# Patient Record
Sex: Female | Born: 1975
Health system: Southern US, Community
[De-identification: ages and names within clinical notes are randomized; demographics above are authoritative.]

## PROBLEM LIST (undated history)

## (undated) DIAGNOSIS — E7800 Pure hypercholesterolemia, unspecified: Secondary | ICD-10-CM

---

## 2007-09-13 ENCOUNTER — Emergency Department: Payer: Self-pay | Admitting: Emergency Medicine

## 2007-09-16 ENCOUNTER — Emergency Department: Payer: Self-pay | Admitting: Emergency Medicine

## 2007-09-18 ENCOUNTER — Emergency Department: Payer: Self-pay | Admitting: Emergency Medicine

## 2007-10-25 ENCOUNTER — Emergency Department: Payer: Self-pay | Admitting: Emergency Medicine

## 2011-01-27 ENCOUNTER — Emergency Department: Payer: Self-pay | Admitting: Emergency Medicine

## 2011-03-24 ENCOUNTER — Ambulatory Visit: Payer: Self-pay

## 2015-06-14 ENCOUNTER — Other Ambulatory Visit: Payer: Self-pay | Admitting: Obstetrics and Gynecology

## 2015-06-14 DIAGNOSIS — Z1231 Encounter for screening mammogram for malignant neoplasm of breast: Secondary | ICD-10-CM

## 2015-06-27 ENCOUNTER — Ambulatory Visit
Admission: RE | Admit: 2015-06-27 | Discharge: 2015-06-27 | Disposition: A | Discharge status: Home / Self Care | Payer: 59 | Source: Ambulatory Visit | Attending: Obstetrics and Gynecology | Admitting: Obstetrics and Gynecology

## 2015-06-27 DIAGNOSIS — Z1231 Encounter for screening mammogram for malignant neoplasm of breast: Secondary | ICD-10-CM | POA: Diagnosis not present

## 2015-07-04 ENCOUNTER — Other Ambulatory Visit: Payer: Self-pay | Admitting: Obstetrics and Gynecology

## 2015-07-04 DIAGNOSIS — N6320 Unspecified lump in the left breast, unspecified quadrant: Secondary | ICD-10-CM

## 2015-07-11 ENCOUNTER — Ambulatory Visit
Admission: RE | Admit: 2015-07-11 | Discharge: 2015-07-11 | Disposition: A | Discharge status: Home / Self Care | Payer: 59 | Source: Ambulatory Visit | Attending: Obstetrics and Gynecology | Admitting: Obstetrics and Gynecology

## 2015-07-11 DIAGNOSIS — N63 Unspecified lump in breast: Secondary | ICD-10-CM | POA: Diagnosis present

## 2015-07-11 DIAGNOSIS — N6320 Unspecified lump in the left breast, unspecified quadrant: Secondary | ICD-10-CM

## 2015-07-12 ENCOUNTER — Other Ambulatory Visit: Payer: 59

## 2016-02-10 DIAGNOSIS — E7800 Pure hypercholesterolemia, unspecified: Secondary | ICD-10-CM | POA: Diagnosis not present

## 2016-02-18 DIAGNOSIS — E7800 Pure hypercholesterolemia, unspecified: Secondary | ICD-10-CM | POA: Diagnosis not present

## 2016-05-13 DIAGNOSIS — J301 Allergic rhinitis due to pollen: Secondary | ICD-10-CM | POA: Diagnosis not present

## 2016-06-16 ENCOUNTER — Other Ambulatory Visit: Payer: Self-pay | Admitting: Obstetrics and Gynecology

## 2016-06-16 DIAGNOSIS — N62 Hypertrophy of breast: Secondary | ICD-10-CM | POA: Diagnosis not present

## 2016-06-16 DIAGNOSIS — Z01419 Encounter for gynecological examination (general) (routine) without abnormal findings: Secondary | ICD-10-CM | POA: Diagnosis not present

## 2016-06-16 DIAGNOSIS — Z1211 Encounter for screening for malignant neoplasm of colon: Secondary | ICD-10-CM | POA: Diagnosis not present

## 2016-06-16 DIAGNOSIS — Z1231 Encounter for screening mammogram for malignant neoplasm of breast: Secondary | ICD-10-CM

## 2016-06-22 DIAGNOSIS — Z01419 Encounter for gynecological examination (general) (routine) without abnormal findings: Secondary | ICD-10-CM | POA: Diagnosis not present

## 2016-07-21 ENCOUNTER — Ambulatory Visit
Admission: RE | Admit: 2016-07-21 | Discharge: 2016-07-21 | Disposition: A | Discharge status: Home / Self Care | Payer: 59 | Source: Ambulatory Visit | Attending: Obstetrics and Gynecology | Admitting: Obstetrics and Gynecology

## 2016-07-21 DIAGNOSIS — Z1231 Encounter for screening mammogram for malignant neoplasm of breast: Secondary | ICD-10-CM | POA: Insufficient documentation

## 2016-08-12 DIAGNOSIS — E7800 Pure hypercholesterolemia, unspecified: Secondary | ICD-10-CM | POA: Diagnosis not present

## 2016-08-18 DIAGNOSIS — E7800 Pure hypercholesterolemia, unspecified: Secondary | ICD-10-CM | POA: Diagnosis not present

## 2016-12-16 DIAGNOSIS — B9689 Other specified bacterial agents as the cause of diseases classified elsewhere: Secondary | ICD-10-CM | POA: Diagnosis not present

## 2016-12-16 DIAGNOSIS — J019 Acute sinusitis, unspecified: Secondary | ICD-10-CM | POA: Diagnosis not present

## 2016-12-16 DIAGNOSIS — J209 Acute bronchitis, unspecified: Secondary | ICD-10-CM | POA: Diagnosis not present

## 2017-02-04 DIAGNOSIS — Z0000 Encounter for general adult medical examination without abnormal findings: Secondary | ICD-10-CM | POA: Diagnosis not present

## 2017-02-04 DIAGNOSIS — E7800 Pure hypercholesterolemia, unspecified: Secondary | ICD-10-CM | POA: Diagnosis not present

## 2017-02-25 DIAGNOSIS — E7800 Pure hypercholesterolemia, unspecified: Secondary | ICD-10-CM | POA: Diagnosis not present

## 2017-02-25 DIAGNOSIS — Z0000 Encounter for general adult medical examination without abnormal findings: Secondary | ICD-10-CM | POA: Diagnosis not present

## 2017-03-10 DIAGNOSIS — H5213 Myopia, bilateral: Secondary | ICD-10-CM | POA: Diagnosis not present

## 2017-07-28 ENCOUNTER — Other Ambulatory Visit: Payer: Self-pay | Admitting: Obstetrics and Gynecology

## 2017-07-28 DIAGNOSIS — Z1231 Encounter for screening mammogram for malignant neoplasm of breast: Secondary | ICD-10-CM | POA: Diagnosis not present

## 2017-07-28 DIAGNOSIS — Z01419 Encounter for gynecological examination (general) (routine) without abnormal findings: Secondary | ICD-10-CM | POA: Diagnosis not present

## 2017-07-28 DIAGNOSIS — Z1211 Encounter for screening for malignant neoplasm of colon: Secondary | ICD-10-CM | POA: Diagnosis not present

## 2017-08-18 ENCOUNTER — Ambulatory Visit
Admission: RE | Admit: 2017-08-18 | Discharge: 2017-08-18 | Disposition: A | Discharge status: Home / Self Care | Payer: 59 | Source: Ambulatory Visit | Attending: Obstetrics and Gynecology | Admitting: Obstetrics and Gynecology

## 2017-08-18 DIAGNOSIS — Z1231 Encounter for screening mammogram for malignant neoplasm of breast: Secondary | ICD-10-CM | POA: Diagnosis not present

## 2017-08-19 DIAGNOSIS — E7800 Pure hypercholesterolemia, unspecified: Secondary | ICD-10-CM | POA: Diagnosis not present

## 2017-08-26 DIAGNOSIS — E7800 Pure hypercholesterolemia, unspecified: Secondary | ICD-10-CM | POA: Diagnosis not present

## 2017-11-01 DIAGNOSIS — Z3042 Encounter for surveillance of injectable contraceptive: Secondary | ICD-10-CM | POA: Diagnosis not present

## 2017-12-07 ENCOUNTER — Ambulatory Visit: Payer: 59 | Admitting: Podiatry

## 2017-12-21 ENCOUNTER — Encounter: Payer: Self-pay | Admitting: Podiatry

## 2017-12-21 ENCOUNTER — Ambulatory Visit (INDEPENDENT_AMBULATORY_CARE_PROVIDER_SITE_OTHER): Payer: 59

## 2017-12-21 ENCOUNTER — Other Ambulatory Visit: Payer: Self-pay | Admitting: Podiatry

## 2017-12-21 ENCOUNTER — Ambulatory Visit (INDEPENDENT_AMBULATORY_CARE_PROVIDER_SITE_OTHER): Payer: 59 | Admitting: Podiatry

## 2017-12-21 VITALS — BP 122/64 | HR 66

## 2017-12-21 DIAGNOSIS — M722 Plantar fascial fibromatosis: Secondary | ICD-10-CM | POA: Diagnosis not present

## 2017-12-21 DIAGNOSIS — M79671 Pain in right foot: Secondary | ICD-10-CM

## 2017-12-21 DIAGNOSIS — M79672 Pain in left foot: Principal | ICD-10-CM

## 2017-12-21 MED ORDER — METHYLPREDNISOLONE 4 MG PO TBPK: ORAL_TABLET | ORAL | 0 refills | Status: AC

## 2017-12-21 MED ORDER — MELOXICAM 15 MG PO TABS: 15.0000 mg | ORAL_TABLET | Freq: Every day | ORAL | 1 refills | Status: AC

## 2017-12-22 NOTE — Progress Notes (Signed)
   Subjective: 42 year old female presenting today as a new patient with a chief complaint of intermittent throbbing pain located on the plantar aspect of the feet bilaterally. She reports associated burning and itching of the plantar feet. She states she recently started a new job and is on her feet more now. She has not done anything for treatment. She denies modifying factors. Patient is here for further evaluation and treatment.   History reviewed. No pertinent past medical history.   Objective: Physical Exam General: The patient is alert and oriented x3 in no acute distress.  Dermatology: Skin is warm, dry and supple bilateral lower extremities. Negative for open lesions or macerations bilateral.   Vascular: Dorsalis Pedis and Posterior Tibial pulses palpable bilateral.  Capillary fill time is immediate to all digits.  Neurological: Epicritic and protective threshold intact bilateral.   Musculoskeletal: Tenderness to palpation to the plantar aspect of the bilateral heels along the plantar fascia. All other joints range of motion within normal limits bilateral. Strength 5/5 in all groups bilateral.   Radiographic exam: Normal osseous mineralization. Joint spaces preserved. No fracture/dislocation/boney destruction. No other soft tissue abnormalities or radiopaque foreign bodies.   Assessment: 1. plantar fasciitis bilateral feet  Plan of Care:  1. Patient evaluated. Xrays reviewed.   2. Declined injections.   3. Rx for Medrol Dose Pak placed 4. Rx for Meloxicam ordered for patient. 5. Appointment with Raiford Nobleick, Pedorthist, for custom molded orthotics.  6. Instructed patient regarding therapies and modalities at home to alleviate symptoms.  7. Return to clinic in 8 weeks.    Works at a [employer] 12 hour shifts.   Felecia ShellingBrent M. Evans, DPM [facility]  Dr. Felecia ShellingBrent M. Evans, DPM    [address]                                   [place], [place] [address]                 Office [phone]  Fax [phone]

## 2018-01-19 ENCOUNTER — Ambulatory Visit (INDEPENDENT_AMBULATORY_CARE_PROVIDER_SITE_OTHER): Payer: 59 | Admitting: Orthotics

## 2018-01-19 DIAGNOSIS — M722 Plantar fascial fibromatosis: Secondary | ICD-10-CM

## 2018-01-19 NOTE — Progress Notes (Signed)

## 2018-01-24 DIAGNOSIS — Z3042 Encounter for surveillance of injectable contraceptive: Secondary | ICD-10-CM | POA: Diagnosis not present

## 2018-02-09 ENCOUNTER — Other Ambulatory Visit: Payer: 59 | Admitting: Orthotics

## 2018-02-15 ENCOUNTER — Encounter: Payer: 59 | Admitting: Podiatry

## 2018-02-20 NOTE — Progress Notes (Signed)
This encounter was created in error - please disregard.

## 2018-04-18 DIAGNOSIS — Z3042 Encounter for surveillance of injectable contraceptive: Secondary | ICD-10-CM | POA: Diagnosis not present

## 2018-05-30 DIAGNOSIS — H5213 Myopia, bilateral: Secondary | ICD-10-CM | POA: Diagnosis not present

## 2018-08-04 ENCOUNTER — Other Ambulatory Visit: Payer: Self-pay | Admitting: Obstetrics and Gynecology

## 2018-08-04 DIAGNOSIS — Z1231 Encounter for screening mammogram for malignant neoplasm of breast: Secondary | ICD-10-CM

## 2018-09-13 ENCOUNTER — Ambulatory Visit
Admission: RE | Admit: 2018-09-13 | Discharge: 2018-09-13 | Disposition: A | Discharge status: Home / Self Care | Payer: 59 | Source: Ambulatory Visit | Attending: Obstetrics and Gynecology | Admitting: Obstetrics and Gynecology

## 2018-09-13 DIAGNOSIS — Z1231 Encounter for screening mammogram for malignant neoplasm of breast: Secondary | ICD-10-CM | POA: Diagnosis not present

## 2019-09-23 IMAGING — MG MM DIGITAL SCREENING BILAT W/ TOMO W/ CAD
8 of 14 series · 8 of 38 positions shown · non-contrast
Comparison: Previous exam(s).

ACR Breast Density Category a: The breast tissue is almost entirely
fatty.

CLINICAL DATA: Screening.

EXAM:
DIGITAL SCREENING BILATERAL MAMMOGRAM WITH TOMO AND CAD

[R CC synth-2D (1 of 2)]
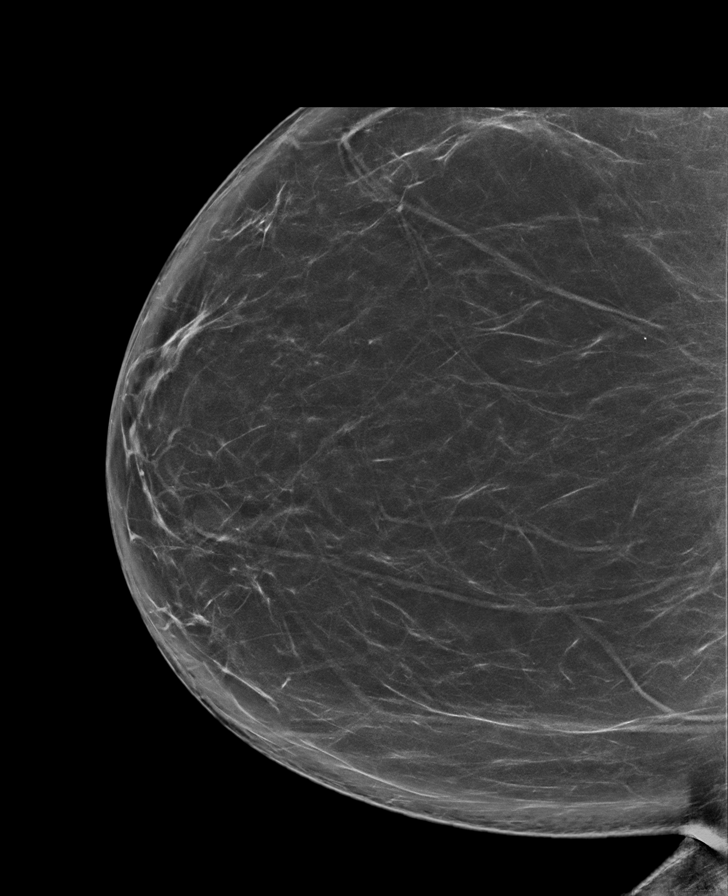

[R MLO synth-2D (1 of 2)]
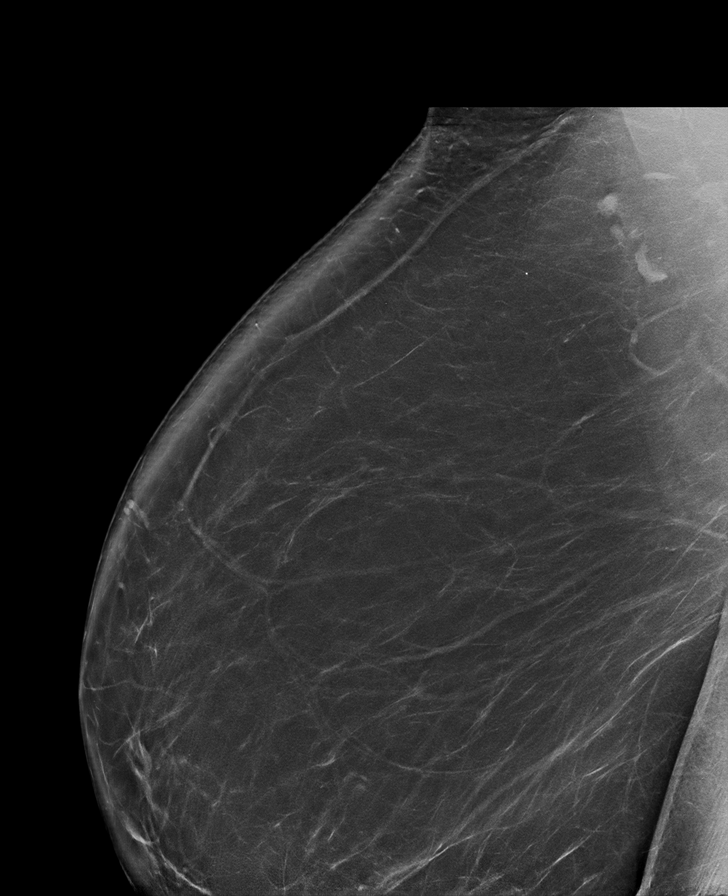

[R MLO synth-2D (2 of 2)]
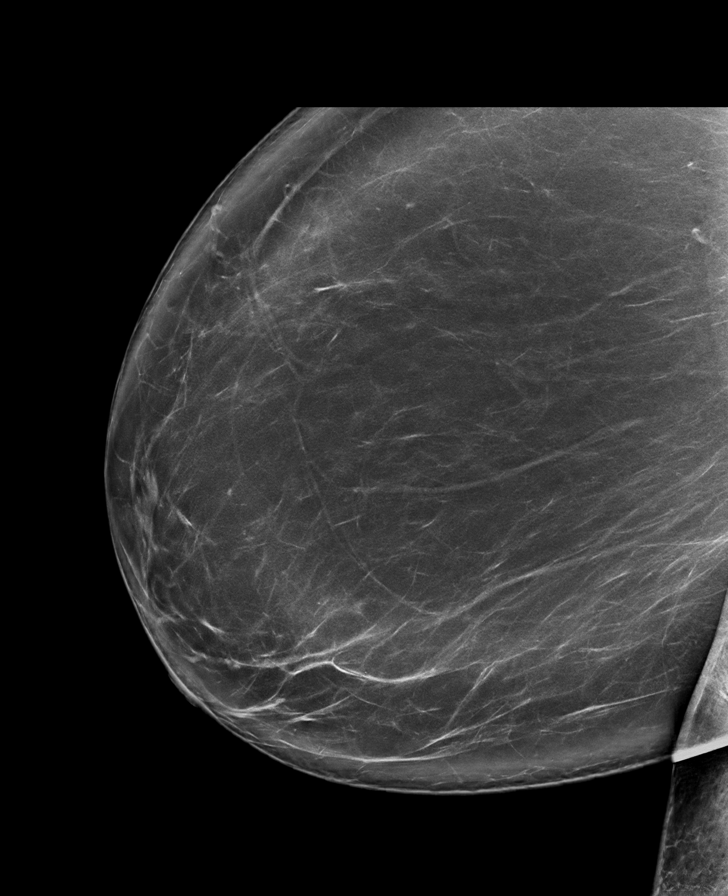

[R CC synth-2D (2 of 2)]
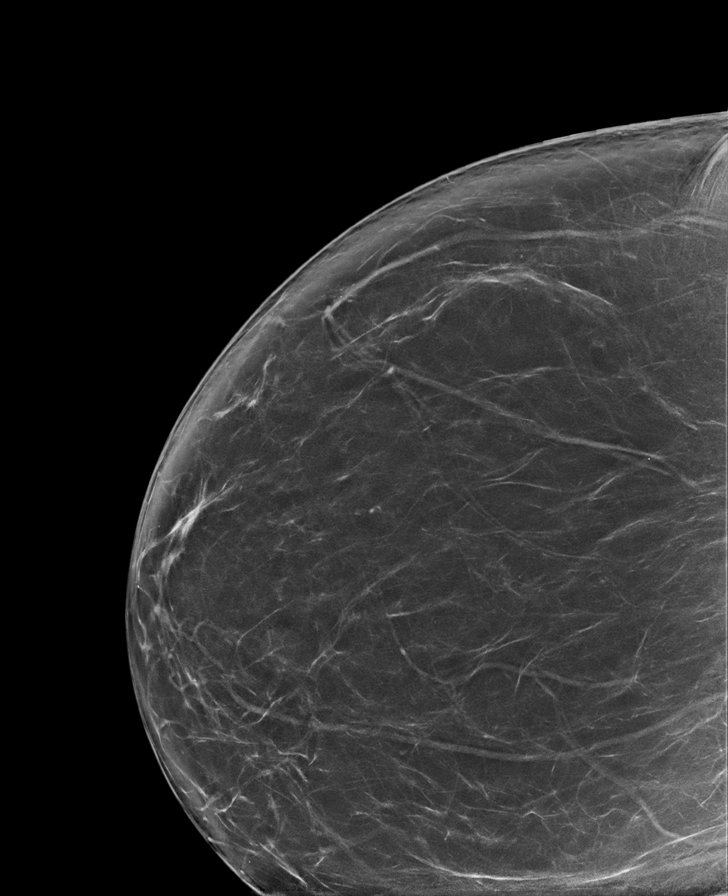

[L CC synth-2D (1 of 2)]
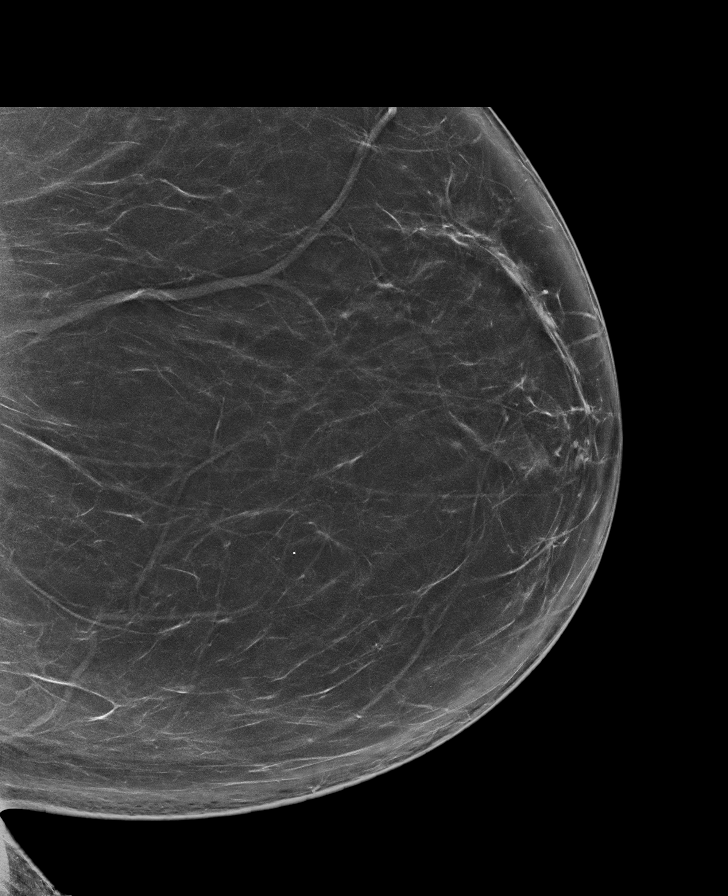

[L MLO synth-2D (1 of 2)]
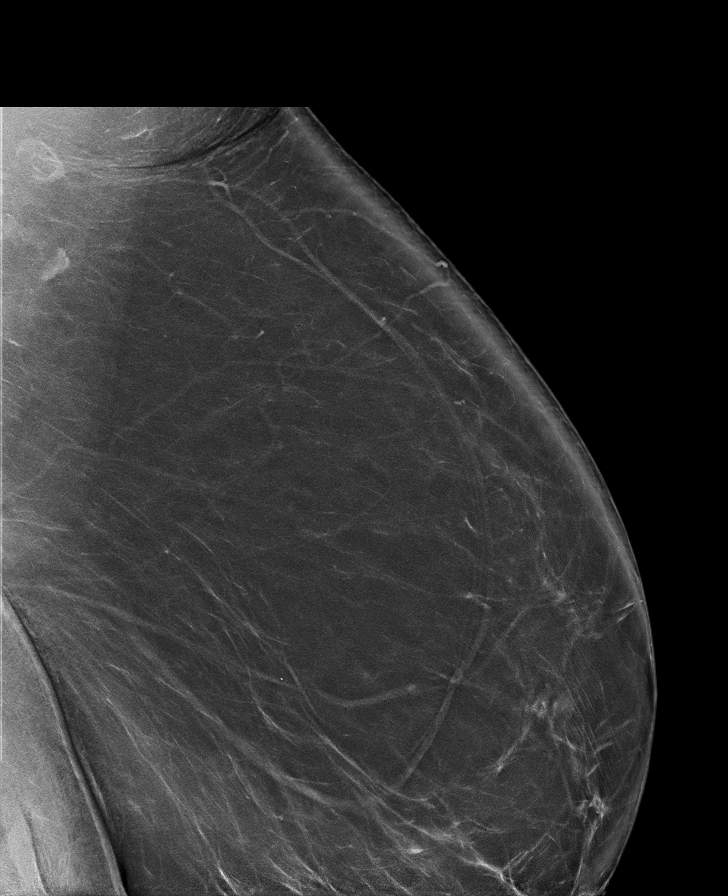

[L CC synth-2D (2 of 2)]
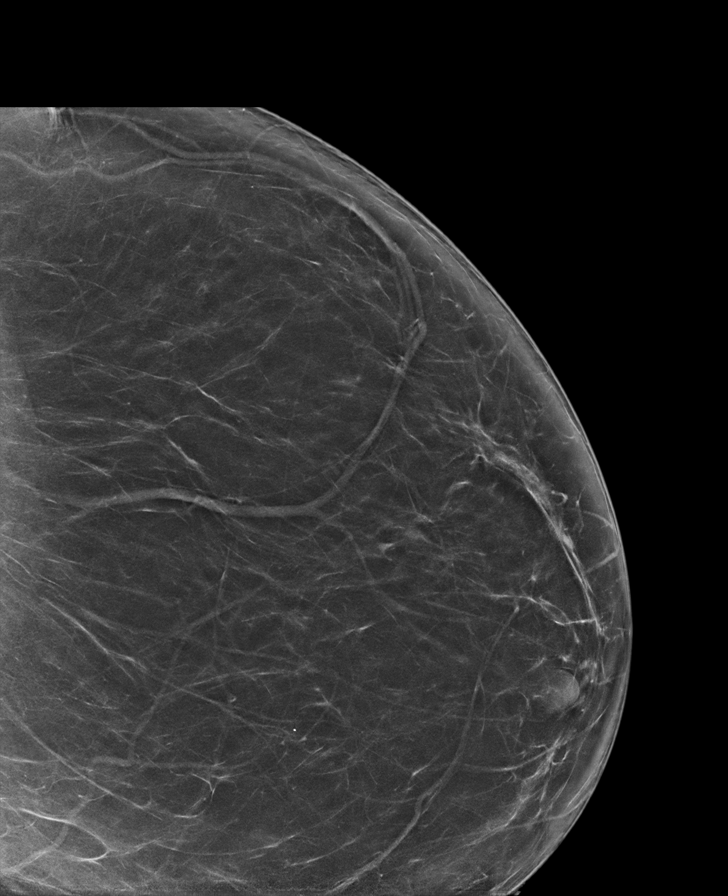

[L MLO synth-2D (2 of 2)]
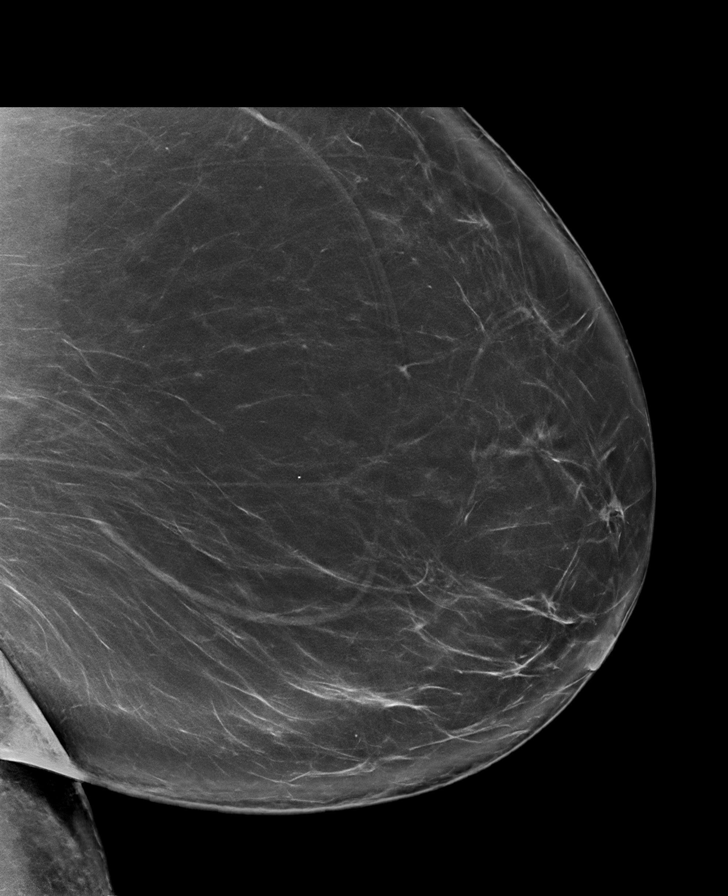

[8 of 38 positions shown; findings below may reference images not displayed]

FINDINGS: There are no findings suspicious for malignancy. Images were
processed with CAD.
IMPRESSION: No mammographic evidence of malignancy. A result letter of this
screening mammogram will be mailed directly to the patient.

RECOMMENDATION:
Screening mammogram in one year. (Code:[id number]-[id number])

BI-RADS CATEGORY  1: Negative.

## 2022-02-18 ENCOUNTER — Other Ambulatory Visit: Payer: Self-pay

## 2022-11-02 ENCOUNTER — Other Ambulatory Visit: Payer: Self-pay | Admitting: Family Medicine

## 2022-11-02 DIAGNOSIS — Z1231 Encounter for screening mammogram for malignant neoplasm of breast: Secondary | ICD-10-CM

## 2022-11-17 ENCOUNTER — Ambulatory Visit
Admission: RE | Admit: 2022-11-17 | Discharge: 2022-11-17 | Disposition: A | Discharge status: Home / Self Care | Payer: 59 | Source: Ambulatory Visit | Attending: Family Medicine | Admitting: Family Medicine

## 2022-11-17 DIAGNOSIS — Z1231 Encounter for screening mammogram for malignant neoplasm of breast: Secondary | ICD-10-CM | POA: Diagnosis present

## 2022-12-15 ENCOUNTER — Ambulatory Visit
Admission: RE | Admit: 2022-12-15 | Discharge: 2022-12-15 | Disposition: A | Discharge status: Home / Self Care | Payer: 59 | Attending: Gastroenterology | Admitting: Gastroenterology

## 2022-12-15 ENCOUNTER — Ambulatory Visit: Payer: 59 | Admitting: Anesthesiology

## 2022-12-15 ENCOUNTER — Other Ambulatory Visit: Payer: Self-pay

## 2022-12-15 ENCOUNTER — Encounter: Payer: Self-pay | Admitting: *Deleted

## 2022-12-15 ENCOUNTER — Encounter
Admission: RE | Disposition: A | Discharge status: Home / Self Care | Payer: Self-pay | Source: Home / Self Care | Attending: Gastroenterology

## 2022-12-15 DIAGNOSIS — Z6841 Body Mass Index (BMI) 40.0 and over, adult: Secondary | ICD-10-CM | POA: Diagnosis not present

## 2022-12-15 DIAGNOSIS — Z1211 Encounter for screening for malignant neoplasm of colon: Secondary | ICD-10-CM | POA: Diagnosis present

## 2022-12-15 DIAGNOSIS — E785 Hyperlipidemia, unspecified: Secondary | ICD-10-CM | POA: Diagnosis not present

## 2022-12-15 DIAGNOSIS — K640 First degree hemorrhoids: Secondary | ICD-10-CM | POA: Insufficient documentation

## 2022-12-15 HISTORY — PX: COLONOSCOPY WITH PROPOFOL: SHX5780

## 2022-12-15 HISTORY — DX: Pure hypercholesterolemia, unspecified: E78.00

## 2022-12-15 SURGERY — COLONOSCOPY WITH PROPOFOL
Anesthesia: General

## 2022-12-15 MED ORDER — GLYCOPYRROLATE 0.2 MG/ML IJ SOLN
INTRAMUSCULAR | Status: DC | PRN
  Administered 2022-12-15: .2 mg via INTRAVENOUS

## 2022-12-15 MED ORDER — PROPOFOL 500 MG/50ML IV EMUL
INTRAVENOUS | Status: DC | PRN
  Administered 2022-12-15: 165 ug/kg/min via INTRAVENOUS

## 2022-12-15 MED ORDER — LIDOCAINE HCL (PF) 1 % IJ SOLN
INTRAMUSCULAR | Status: AC
  Filled 2022-12-15: qty 2

## 2022-12-15 MED ORDER — SODIUM CHLORIDE 0.9 % IV SOLN: INTRAVENOUS | Status: DC

## 2022-12-15 MED ORDER — MIDAZOLAM HCL 2 MG/2ML IJ SOLN
INTRAMUSCULAR | Status: DC | PRN
  Administered 2022-12-15: 2 mg via INTRAVENOUS

## 2022-12-15 MED ORDER — PROPOFOL 10 MG/ML IV BOLUS
INTRAVENOUS | Status: DC | PRN
  Administered 2022-12-15: 60 mg via INTRAVENOUS
  Administered 2022-12-15: 10 mg via INTRAVENOUS

## 2022-12-15 MED ORDER — LIDOCAINE HCL (CARDIAC) PF 100 MG/5ML IV SOSY
PREFILLED_SYRINGE | INTRAVENOUS | Status: DC | PRN
  Administered 2022-12-15: 100 mg via INTRAVENOUS

## 2022-12-15 MED ORDER — MIDAZOLAM HCL 2 MG/2ML IJ SOLN
INTRAMUSCULAR | Status: AC
Start: 2022-12-15 — End: ?
  Filled 2022-12-15: qty 2

## 2022-12-15 NOTE — Transfer of Care (Signed)
Immediate Anesthesia Transfer of Care Note  Patient: Cassandra Watkins  Procedure(s) Performed: COLONOSCOPY WITH PROPOFOL  Patient Location: Endoscopy Unit  Anesthesia Type:General  Level of Consciousness: awake, drowsy, and patient cooperative  Airway & Oxygen Therapy: Patient Spontanous Breathing and Patient connected to face mask oxygen  Post-op Assessment: Report given to RN and Post -op Vital signs reviewed and stable  Post vital signs: Reviewed and stable  Last Vitals:  Vitals Value Taken Time  BP 121/74 12/15/22 1020  Temp    Pulse 83 12/15/22 1020  Resp 21 12/15/22 1020  SpO2 100 % 12/15/22 1020    Last Pain:  Vitals:   12/15/22 1020  TempSrc:   PainSc: 0-No pain         Complications: No notable events documented.

## 2022-12-15 NOTE — Op Note (Signed)
[facility] Patient Name: Cassandra Watkins Procedure Date: 12/15/2022 9:23 AM MRN: [id number] Account #: [id number] Date of Birth: 1975-12-11 Admit Type: Outpatient Age: 47 Room: [facility] ENDO ROOM 1 Gender: Female Note Status: Finalized Instrument Name: Prentice Docker [id number] Procedure:             Colonoscopy Indications:           Screening for colorectal malignant neoplasm Providers:             Eather Colas MD, MD Medicines:             Monitored Anesthesia Care Complications:         No immediate complications. Procedure:             Pre-Anesthesia Assessment:                        - Prior to the procedure, a History and Physical was                         performed, and patient medications and allergies were                         reviewed. The patient is competent. The risks and                         benefits of the procedure and the sedation options and                         risks were discussed with the patient. All questions                         were answered and informed consent was obtained.                         Patient identification and proposed procedure were                         verified by the physician, the nurse, the                         anesthesiologist, the anesthetist and the technician                         in the endoscopy suite. Mental Status Examination:                         alert and oriented. Airway Examination: normal                         oropharyngeal airway and neck mobility. Respiratory                         Examination: clear to auscultation. CV Examination:                         normal. Prophylactic Antibiotics: The patient does not                         require prophylactic antibiotics. Prior  Anticoagulants: The patient has taken no anticoagulant                         or antiplatelet agents. ASA Grade Assessment: III - A                         patient with  severe systemic disease. After reviewing                         the risks and benefits, the patient was deemed in                         satisfactory condition to undergo the procedure. The                         anesthesia plan was to use monitored anesthesia care                         (MAC). Immediately prior to administration of                         medications, the patient was re-assessed for adequacy                         to receive sedatives. The heart rate, respiratory                         rate, oxygen saturations, blood pressure, adequacy of                         pulmonary ventilation, and response to care were                         monitored throughout the procedure. The physical                         status of the patient was re-assessed after the                         procedure.                        After obtaining informed consent, the colonoscope was                         passed under direct vision. Throughout the procedure,                         the patient's blood pressure, pulse, and oxygen                         saturations were monitored continuously. The                         Colonoscope was introduced through the anus and                         advanced to the the terminal ileum. The colonoscopy  was performed without difficulty. The patient                         tolerated the procedure well. The quality of the bowel                         preparation was excellent. The terminal ileum,                         ileocecal valve, appendiceal orifice, and rectum were                         photographed. Findings:      The perianal and digital rectal examinations were normal.      The terminal ileum appeared normal.      Internal hemorrhoids were found during retroflexion. The hemorrhoids       were Grade I (internal hemorrhoids that do not prolapse).      The exam was otherwise without abnormality on direct and  retroflexion       views. Impression:            - The examined portion of the ileum was normal.                        - Internal hemorrhoids.                        - The examination was otherwise normal on direct and                         retroflexion views.                        - No specimens collected. Recommendation:        - Discharge patient to home.                        - Resume previous diet.                        - Continue present medications.                        - Repeat colonoscopy in 10 years for screening                         purposes.                        - Return to referring physician as previously                         scheduled. Procedure Code(s):     --- Professional ---                        N6295, Colorectal cancer screening; colonoscopy on                         individual not meeting criteria for high risk Diagnosis Code(s):     --- Professional ---  Z12.11, Encounter for screening for malignant neoplasm                         of colon                        K64.0, First degree hemorrhoids CPT copyright 2022 American Medical Association. All rights reserved. The codes documented in this report are preliminary and upon coder review may  be revised to meet current compliance requirements. Eather Colas MD, MD 12/15/2022 10:19:14 AM Number of Addenda: 0 Note Initiated On: 12/15/2022 9:23 AM Scope Withdrawal Time: 0 hours 7 minutes 25 seconds  Total Procedure Duration: 0 hours 10 minutes 10 seconds  Estimated Blood Loss:  Estimated blood loss: none.      [facility]

## 2022-12-15 NOTE — Anesthesia Preprocedure Evaluation (Addendum)
Anesthesia Evaluation  Patient identified by MRN, date of birth, ID band Patient awake    Reviewed: Allergy & Precautions, NPO status , Patient's Chart, lab work & pertinent test results  History of Anesthesia Complications Negative for: history of anesthetic complications  Airway Mallampati: I   Neck ROM: Full    Dental no notable dental hx.    Pulmonary neg pulmonary ROS   Pulmonary exam normal breath sounds clear to auscultation       Cardiovascular Exercise Tolerance: Good negative cardio ROS Normal cardiovascular exam Rhythm:Regular Rate:Normal     Neuro/Psych negative neurological ROS     GI/Hepatic negative GI ROS,,,  Endo/Other    Class 3 obesity  Renal/GU negative Renal ROS     Musculoskeletal   Abdominal   Peds  Hematology negative hematology ROS (+)   Anesthesia Other Findings   Reproductive/Obstetrics                             Anesthesia Physical Anesthesia Plan  ASA: 3  Anesthesia Plan: General   Post-op Pain Management:    Induction: Intravenous  PONV Risk Score and Plan: 3 and Propofol infusion, TIVA and Treatment may vary due to age or medical condition  Airway Management Planned: Natural Airway  Additional Equipment:   Intra-op Plan:   Post-operative Plan:   Informed Consent: I have reviewed the patients History and Physical, chart, labs and discussed the procedure including the risks, benefits and alternatives for the proposed anesthesia with the patient or authorized representative who has indicated his/her understanding and acceptance.       Plan Discussed with: CRNA  Anesthesia Plan Comments: (LMA/GETA backup discussed.  Patient consented for risks of anesthesia including but not limited to:  - adverse reactions to medications - damage to eyes, teeth, lips or other oral mucosa - nerve damage due to positioning  - sore throat or hoarseness -  damage to heart, brain, nerves, lungs, other parts of body or loss of life  Informed patient about role of CRNA in peri- and intra-operative care.  Patient voiced understanding.)       Anesthesia Quick Evaluation

## 2022-12-15 NOTE — Anesthesia Postprocedure Evaluation (Signed)
Anesthesia Post Note  Patient: Cassandra Watkins  Procedure(s) Performed: COLONOSCOPY WITH PROPOFOL  Patient location during evaluation: PACU Anesthesia Type: General Level of consciousness: awake and alert, oriented and patient cooperative Pain management: pain level controlled Vital Signs Assessment: post-procedure vital signs reviewed and stable Respiratory status: spontaneous breathing, nonlabored ventilation and respiratory function stable Cardiovascular status: blood pressure returned to baseline and stable Postop Assessment: adequate PO intake Anesthetic complications: no   No notable events documented.   Last Vitals:  Vitals:   12/15/22 1020 12/15/22 1030  BP: 121/74 (!) 141/83  Pulse: 83 72  Resp: (!) 21 12  Temp:  (!) 36.1 C  SpO2: 100% 98%    Last Pain:  Vitals:   12/15/22 1030  TempSrc: Temporal  PainSc: 0-No pain                 Reed Breech

## 2022-12-15 NOTE — Anesthesia Procedure Notes (Signed)
Procedure Name: General with mask airway Date/Time: 12/15/2022 10:06 AM  Performed by: Mohammed Kindle, CRNAPre-anesthesia Checklist: Patient identified, Emergency Drugs available, Suction available and Patient being monitored Patient Re-evaluated:Patient Re-evaluated prior to induction Oxygen Delivery Method: Simple face mask Induction Type: IV induction Placement Confirmation: positive ETCO2, CO2 detector and breath sounds checked- equal and bilateral Dental Injury: Teeth and Oropharynx as per pre-operative assessment

## 2022-12-15 NOTE — H&P (Signed)
Outpatient short stay form Pre-procedure 12/15/2022  Regis Bill, MD  Primary Physician: Marisue Ivan, MD  Reason for visit:  Screening  History of present illness:    47 y/o lady with HLD and obesity here for index screening colonoscopy. No blood thinners. No family history of GI malignancies. No significant abdominal surgeries.    Current Facility-Administered Medications:    0.9 %  sodium chloride infusion, , Intravenous, Continuous, Raeford Brandenburg, Rossie Muskrat, MD  Medications Prior to Admission  Medication Sig Dispense Refill Last Dose   atorvastatin (LIPITOR) 40 MG tablet Take 40 mg by mouth every evening.  1 12/13/2022   methylPREDNISolone (MEDROL DOSEPAK) 4 MG TBPK tablet 6 day dose pack - take as directed (Patient not taking: Reported on 12/15/2022) 21 tablet 0 Not Taking     No Known Allergies   Past Medical History:  Diagnosis Date   Hypercholesteremia     Review of systems:  Otherwise negative.    Physical Exam  Gen: Alert, oriented. Appears stated age.  HEENT: PERRLA. Lungs: No respiratory distress CV: RRR Abd: soft, benign, no masses Ext: No edema    Planned procedures: Proceed with colonoscopy. The patient understands the nature of the planned procedure, indications, risks, alternatives and potential complications including but not limited to bleeding, infection, perforation, damage to internal organs and possible oversedation/side effects from anesthesia. The patient agrees and gives consent to proceed.  Please refer to procedure notes for findings, recommendations and patient disposition/instructions.     Regis Bill, MD [facility]

## 2022-12-15 NOTE — Interval H&P Note (Signed)
History and Physical Interval Note:  12/15/2022 9:33 AM  Cassandra Watkins  has presented today for surgery, with the diagnosis of Z12.11 (ICD-10-CM) - Colon cancer screening.  The various methods of treatment have been discussed with the patient and family. After consideration of risks, benefits and other options for treatment, the patient has consented to  Procedure(s): COLONOSCOPY WITH PROPOFOL (N/A) as a surgical intervention.  The patient's history has been reviewed, patient examined, no change in status, stable for surgery.  I have reviewed the patient's chart and labs.  Questions were answered to the patient's satisfaction.     Regis Bill  Ok to proceed with colonoscopy

## 2022-12-16 ENCOUNTER — Encounter: Payer: Self-pay | Admitting: Gastroenterology

## 2023-03-15 ENCOUNTER — Encounter: Payer: Self-pay | Admitting: Gastroenterology

## 2023-08-31 ENCOUNTER — Other Ambulatory Visit: Payer: Self-pay | Admitting: Obstetrics and Gynecology

## 2023-08-31 DIAGNOSIS — Z1231 Encounter for screening mammogram for malignant neoplasm of breast: Secondary | ICD-10-CM

## 2023-11-18 ENCOUNTER — Ambulatory Visit
Admission: RE | Admit: 2023-11-18 | Discharge: 2023-11-18 | Disposition: A | Discharge status: Home / Self Care | Source: Ambulatory Visit | Attending: Obstetrics and Gynecology | Admitting: Obstetrics and Gynecology

## 2023-11-18 DIAGNOSIS — Z1231 Encounter for screening mammogram for malignant neoplasm of breast: Secondary | ICD-10-CM | POA: Insufficient documentation
